# Patient Record
Sex: Female | Born: 1960 | Race: White | Hispanic: No | Marital: Married | Smoking: Never smoker
Health system: Southern US, Community
[De-identification: ages and names within clinical notes are randomized; demographics above are authoritative.]

## PROBLEM LIST (undated history)

## (undated) DIAGNOSIS — I10 Essential (primary) hypertension: Secondary | ICD-10-CM

## (undated) DIAGNOSIS — E079 Disorder of thyroid, unspecified: Secondary | ICD-10-CM

## (undated) HISTORY — PX: KNEE SURGERY: SHX244

---

## 2015-06-29 ENCOUNTER — Encounter (HOSPITAL_COMMUNITY): Payer: Self-pay | Admitting: Emergency Medicine

## 2015-06-29 DIAGNOSIS — I10 Essential (primary) hypertension: Secondary | ICD-10-CM | POA: Insufficient documentation

## 2015-06-29 DIAGNOSIS — Z79899 Other long term (current) drug therapy: Secondary | ICD-10-CM | POA: Insufficient documentation

## 2015-06-29 DIAGNOSIS — R2242 Localized swelling, mass and lump, left lower limb: Secondary | ICD-10-CM | POA: Diagnosis not present

## 2015-06-29 DIAGNOSIS — M25562 Pain in left knee: Secondary | ICD-10-CM | POA: Insufficient documentation

## 2015-06-29 DIAGNOSIS — E079 Disorder of thyroid, unspecified: Secondary | ICD-10-CM | POA: Insufficient documentation

## 2015-06-29 DIAGNOSIS — M79662 Pain in left lower leg: Secondary | ICD-10-CM | POA: Diagnosis present

## 2015-06-29 DIAGNOSIS — M96830 Postprocedural hemorrhage and hematoma of a musculoskeletal structure following a musculoskeletal system procedure: Secondary | ICD-10-CM | POA: Insufficient documentation

## 2015-06-29 NOTE — ED Notes (Addendum)
Pt. reports left posterior knee pain with bruise and  swelling  onset last night , denies injury , pt. uses a knee brace s/p knee surgery last week at [facility].

## 2015-06-30 ENCOUNTER — Emergency Department (HOSPITAL_COMMUNITY)
Admission: EM | Admit: 2015-06-30 | Discharge: 2015-06-30 | Disposition: A | Discharge status: Home / Self Care | Payer: BLUE CROSS/BLUE SHIELD | Attending: Emergency Medicine | Admitting: Emergency Medicine

## 2015-06-30 ENCOUNTER — Emergency Department (HOSPITAL_COMMUNITY): Payer: BLUE CROSS/BLUE SHIELD

## 2015-06-30 DIAGNOSIS — M79662 Pain in left lower leg: Secondary | ICD-10-CM

## 2015-06-30 DIAGNOSIS — M7989 Other specified soft tissue disorders: Secondary | ICD-10-CM

## 2015-06-30 HISTORY — DX: Disorder of thyroid, unspecified: E07.9

## 2015-06-30 HISTORY — DX: Essential (primary) hypertension: I10

## 2015-06-30 MED ORDER — HYDROCODONE-ACETAMINOPHEN 5-325 MG PO TABS: 1.0000 | ORAL_TABLET | Freq: Four times a day (QID) | ORAL | Status: AC | PRN

## 2015-06-30 MED ORDER — MORPHINE SULFATE 4 MG/ML IJ SOLN
4.0000 mg | Freq: Once | INTRAMUSCULAR | Status: AC
  Administered 2015-06-30: 4 mg via INTRAMUSCULAR
  Filled 2015-06-30: qty 1

## 2015-06-30 MED ORDER — RIVAROXABAN 15 MG PO TABS: 15.0000 mg | ORAL_TABLET | Freq: Once | ORAL | Status: DC

## 2015-06-30 MED ORDER — ENOXAPARIN SODIUM 120 MG/0.8ML ~~LOC~~ SOLN
120.0000 mg | SUBCUTANEOUS | Status: AC
  Administered 2015-06-30: 120 mg via SUBCUTANEOUS
  Filled 2015-06-30: qty 0.8

## 2015-06-30 NOTE — ED Provider Notes (Signed)
CSN: [id number]     Arrival date & time 06/29/15  2318 History   First MD Initiated Contact with Patient 06/30/15 0200     Chief Complaint  Patient presents with  . Knee Pain     (Consider location/radiation/quality/duration/timing/severity/associated sxs/prior Treatment) HPI Comments: Patient is a 54 year old female presenting to the emergency department for a violation of left knee and calf swelling and pain. Patient states she had an anterior cruciate ligament repair 1 week ago, states she had to fly in [place] and try down from [place] to [place] for family emergency. She states last night she had new bruising, the shape of her knee immobilizer had increased swelling and pain. Denies any new injuries. She states she has been taking 325 mg of aspirin status post operation but is not on any Lovenox or other blood thinner. She denies any chest pain, shortness of breath, numbness or tingling. Denies any drainage from her surgical incision sites. No history of previous DVT or PE.   Patient is a 54 y.o. female presenting with knee pain.  Knee Pain   Past Medical History  Diagnosis Date  . Hypertension   . Thyroid disease    Past Surgical History  Procedure Laterality Date  . Knee surgery     No family history on file. History  Substance Use Topics  . Smoking status: Never Smoker   . Smokeless tobacco: Not on file  . Alcohol Use: Yes   OB History    No data available     Review of Systems  Musculoskeletal: Positive for myalgias.  All other systems reviewed and are negative.     Allergies  Review of patient's allergies indicates no known allergies.  Home Medications   Prior to Admission medications   Medication Sig Start Date End Date Taking? Authorizing Provider  aspirin EC 325 MG tablet Take 325 mg by mouth at bedtime. 06/20/15  Yes Historical Provider, MD  enalapril-hydrochlorothiazide (VASERETIC) 10-25 MG per tablet Take 1 tablet by mouth daily. 05/17/15  Yes  Historical Provider, MD  levothyroxine (SYNTHROID, LEVOTHROID) 100 MCG tablet Take 100 mcg by mouth daily. 06/13/15  Yes Historical Provider, MD   BP 129/88 mmHg  Pulse 84  Temp(Src) 97.7 F (36.5 C) (Oral)  Resp 24  SpO2 100% Physical Exam  Constitutional: She is oriented to person, place, and time. She appears well-developed and well-nourished. No distress.  HENT:  Head: Normocephalic and atraumatic.  Right Ear: External ear normal.  Left Ear: External ear normal.  Nose: Nose normal.  Mouth/Throat: Oropharynx is clear and moist.  Eyes: Conjunctivae are normal.  Neck: Normal range of motion. Neck supple.  No nuchal rigidity.   Cardiovascular: Normal rate, regular rhythm, normal heart sounds and intact distal pulses.   Pulmonary/Chest: Effort normal.  Abdominal: Soft.  Musculoskeletal:       Left knee: She exhibits swelling and ecchymosis.       Left lower leg: She exhibits tenderness and swelling. She exhibits no deformity.  Left calf swelling. Bruising noted consistent with knee immobilizer. Sensation intact. Surgical incisions clean, dry, intact. No purulent drainage. Mild erythema without warmth noted to left calf. No I swelling or tenderness.  Neurological: She is alert and oriented to person, place, and time.  Skin: Skin is warm and dry. She is not diaphoretic.  Psychiatric: She has a normal mood and affect.  Nursing note and vitals reviewed.   ED Course  Procedures (including critical care time) Medications  morphine 4 MG/ML injection 4  mg (not administered)    Labs Review Labs Reviewed - No data to display  Imaging Review Dg Knee Complete 4 Views Left  06/30/2015   CLINICAL DATA:  54 year old female with left knee pain.  EXAM: LEFT KNEE - COMPLETE 4+ VIEW  COMPARISON:  None.  FINDINGS: There is no acute fracture or dislocation. Prior cruciate ligament surgery noted. There is small suprapatellar effusion. The soft tissues are grossly unremarkable.  IMPRESSION: No  acute fracture or dislocation.   Electronically Signed   By: Elgie CollardArash  Radparvar M.D.   On: 06/30/2015 00:32     EKG Interpretation None      MDM   Final diagnoses:  Pain and swelling of left lower leg    Filed Vitals:   06/30/15 0215  BP: 129/88  Pulse: 84  Temp:   Resp: 24   Afebrile, NAD, non-toxic appearing, AAOx4.   Patient presenting with left calf and knee swelling and pain s/p anterior cruciate ligament [place] ago[place]. Pulses are intact. Bruising is consistent with knee immobilizer rods. Patient is intact. There is no warmth or purulent drainage from incision sites or fever to suggest infectious process. Concern for DVT. No chest pain, shortness of breath, hypoxia, tachycardia or tachypnea to suggest PE at this time. Will discuss with Lovenox and have patient obtain outpatient ultrasound in the morning when offices reopen. Patient d/w with Dr. Mora Bellmanni, agrees with plan.      Francee PiccoloJennifer Matalynn Graff, PA-C 06/30/15 [id number]  Tomasita CrumbleAdeleke Oni, MD 06/30/15 1357

## 2015-06-30 NOTE — Discharge Instructions (Signed)
Please return to the Vascular Lab in the morning for your ultrasound of your leg. You should call to determine your appointment time. Please take pain medication and/or muscle relaxants as prescribed and as needed for pain. Please do not drive on narcotic pain medication or on muscle relaxants. Please read all discharge instructions and return precautions.   Deep Vein Thrombosis A deep vein thrombosis (DVT) is a blood clot that develops in the deep, larger veins of the leg, arm, or pelvis. These are more dangerous than clots that might form in veins near the surface of the body. A DVT can lead to serious and even life-threatening complications if the clot breaks off and travels in the bloodstream to the lungs.  A DVT can damage the valves in your leg veins so that instead of flowing upward, the blood pools in the lower leg. This is called post-thrombotic syndrome, and it can result in pain, swelling, discoloration, and sores on the leg. CAUSES Usually, several things contribute to the formation of blood clots. Contributing factors include:  The flow of blood slows down.  The inside of the vein is damaged in some way.  You have a condition that makes blood clot more easily. RISK FACTORS Some people are more likely than others to develop blood clots. Risk factors include:   Smoking.  Being overweight (obese).  Sitting or lying still for a long time. This includes long-distance travel, paralysis, or recovery from an illness or surgery. Other factors that increase risk are:   Older age, especially over 55 years of age.  Having a family history of blood clots or if you have already had a blot clot.  Having major or lengthy surgery. This is especially true for surgery on the hip, knee, or belly (abdomen). Hip surgery is particularly high risk.  Having a long, thin tube (catheter) placed inside a vein during a medical procedure.  Breaking a hip or leg.  Having cancer or cancer  treatment.  Pregnancy and childbirth.  Hormone changes make the blood clot more easily during pregnancy.  The fetus puts pressure on the veins of the pelvis.  There is a risk of injury to veins during delivery or a caesarean delivery. The risk is highest just after childbirth.  Medicines containing the female hormone estrogen. This includes birth control pills and hormone replacement therapy.  Other circulation or heart problems.  SIGNS AND SYMPTOMS When a clot forms, it can either partially or totally block the blood flow in that vein. Symptoms of a DVT can include:  Swelling of the leg or arm, especially if one side is much worse.  Warmth and redness of the leg or arm, especially if one side is much worse.  Pain in an arm or leg. If the clot is in the leg, symptoms may be more noticeable or worse when standing or walking. The symptoms of a DVT that has traveled to the lungs (pulmonary embolism, PE) usually start suddenly and include:  Shortness of breath.  Coughing.  Coughing up blood or blood-tinged mucus.  Chest pain. The chest pain is often worse with deep breaths.  Rapid heartbeat. Anyone with these symptoms should get emergency medical treatment right away. Do not wait to see if the symptoms will go away. Call your local emergency services (911 in the [place] if you have these symptoms. Do not drive yourself to the hospital. DIAGNOSIS If a DVT is suspected, your health care provider will take a full medical history and perform a  physical exam. Tests that also may be required include:  Blood tests, including studies of the clotting properties of the blood.  Ultrasound to see if you have clots in your legs or lungs.  X-rays to show the flow of blood when dye is injected into the veins (venogram).  Studies of your lungs if you have any chest symptoms. PREVENTION  Exercise the legs regularly. Take a brisk 30-minute walk every day.  Maintain a weight that is  appropriate for your height.  Avoid sitting or lying in bed for long periods of time without moving your legs.  Women, particularly those over the age of 55 years, should consider the risks and benefits of taking estrogen medicines, including birth control pills.  Do not smoke, especially if you take estrogen medicines.  Long-distance travel can increase your risk of DVT. You should exercise your legs by walking or pumping the muscles every hour.  Many of the risk factors above relate to situations that exist with hospitalization, either for illness, injury, or elective surgery. Prevention may include medical and nonmedical measures.  Your health care provider will assess you for the need for venous thromboembolism prevention when you are admitted to the hospital. If you are having surgery, your surgeon will assess you the day of or day after surgery. TREATMENT Once identified, a DVT can be treated. It can also be prevented in some circumstances. Once you have had a DVT, you may be at increased risk for a DVT in the future. The most common treatment for DVT is blood-thinning (anticoagulant) medicine, which reduces the blood's tendency to clot. Anticoagulants can stop new blood clots from forming and stop old clots from growing. They cannot dissolve existing clots. Your body does this by itself over time. Anticoagulants can be given by mouth, through an IV tube, or by injection. Your health care provider will determine the best program for you. Other medicines or treatments that may be used are:  Heparin or related medicines (low molecular weight heparin) are often the first treatment for a blood clot. They act quickly. However, they cannot be taken orally and must be given either in shot form or by IV tube.  Heparin can cause a fall in a component of blood that stops bleeding and forms blood clots (platelets). You will be monitored with blood tests to be sure this does not occur.  Warfarin is an  anticoagulant that can be swallowed. It takes a few days to start working, so usually heparin or related medicines are used in combination. Once warfarin is working, heparin is usually stopped.  Factor Xa inhibitor medicines, such as rivaroxaban and apixaban, also reduce blood clotting. These medicines are taken orally and can often be used without heparin or related medicines.  Less commonly, clot dissolving drugs (thrombolytics) are used to dissolve a DVT. They carry a high risk of bleeding, so they are used mainly in severe cases where your life or a part of your body is threatened.  Very rarely, a blood clot in the leg needs to be removed surgically.  If you are unable to take anticoagulants, your health care provider may arrange for you to have a filter placed in a main vein in your abdomen. This filter prevents clots from traveling to your lungs. HOME CARE INSTRUCTIONS  Take all medicines as directed by your health care provider.  Learn as much as you can about DVT.  Wear a medical alert bracelet or carry a medical alert card.  Ask your  health care provider how soon you can go back to normal activities. It is important to stay active to prevent blood clots. If you are on anticoagulant medicine, avoid contact sports.  It is very important to exercise. This is especially important while traveling, sitting, or standing for long periods of time. Exercise your legs by walking or by tightening and relaxing your leg muscles regularly. Take frequent walks.  You may need to wear compression stockings. These are tight elastic stockings that apply pressure to the lower legs. This pressure can help keep the blood in the legs from clotting. Taking Warfarin Warfarin is a daily medicine that is taken by mouth. Your health care provider will advise you on the length of treatment (usually 3-6 months, sometimes lifelong). If you take warfarin:  Understand how to take warfarin and foods that can affect  how warfarin works in Veterinary surgeon.  Too much and too little warfarin are both dangerous. Too much warfarin increases the risk of bleeding. Too little warfarin continues to allow the risk for blood clots. Warfarin and Regular Blood Testing While taking warfarin, you will need to have regular blood tests to measure your blood clotting time. These blood tests usually include both the prothrombin time (PT) and international normalized ratio (INR) tests. The PT and INR results allow your health care provider to adjust your dose of warfarin. It is very important that you have your PT and INR tested as often as directed by your health care provider.  Warfarin and Your Diet Avoid major changes in your diet, or notify your health care provider before changing your diet. Arrange a visit with a registered dietitian to answer your questions. Many foods, especially foods high in vitamin K, can interfere with warfarin and affect the PT and INR results. You should eat a consistent amount of foods high in vitamin K. Foods high in vitamin K include:   Spinach, kale, broccoli, cabbage, collard and turnip greens, Brussels sprouts, peas, cauliflower, seaweed, and parsley.  Beef and pork liver.  Green tea.  Soybean oil. Warfarin with Other Medicines Many medicines can interfere with warfarin and affect the PT and INR results. You must:  Tell your health care provider about any and all medicines, vitamins, and supplements you take, including aspirin and other over-the-counter anti-inflammatory medicines. Be especially cautious with aspirin and anti-inflammatory medicines. Ask your health care provider before taking these.  Do not take or discontinue any prescribed or over-the-counter medicine except on the advice of your health care provider or pharmacist. Warfarin Side Effects Warfarin can have side effects, such as easy bruising and difficulty stopping bleeding. Ask your health care provider or pharmacist about  other side effects of warfarin. You will need to:  Hold pressure over cuts for longer than usual.  Notify your dentist and other health care providers that you are taking warfarin before you undergo any procedures where bleeding may occur. Warfarin with Alcohol and Tobacco   Drinking alcohol frequently can increase the effect of warfarin, leading to excess bleeding. It is best to avoid alcoholic drinks or to consume only very small amounts while taking warfarin. Notify your health care provider if you change your alcohol intake.   Do not use any tobacco products including cigarettes, chewing tobacco, or electronic cigarettes. If you smoke, quit. Ask your health care provider for help with quitting smoking. Alternative Medicines to Warfarin: Factor Xa Inhibitor Medicines  These blood-thinning medicines are taken by mouth, usually for several weeks or longer. It is  important to take the medicine every single day at the same time each day.  There are no regular blood tests required when using these medicines.  There are fewer food and drug interactions than with warfarin.  The side effects of this class of medicine are similar to those of warfarin, including excessive bruising or bleeding. Ask your health care provider or pharmacist about other potential side effects. SEEK MEDICAL CARE IF:  You notice a rapid heartbeat.  You feel weaker or more tired than usual.  You feel faint.  You notice increased bruising.  You feel your symptoms are not getting better in the time expected.  You believe you are having side effects of medicine. SEEK IMMEDIATE MEDICAL CARE IF:  You have chest pain.  You have trouble breathing.  You have new or increased swelling or pain in one leg.  You cough up blood.  You notice blood in vomit, in a bowel movement, or in urine. MAKE SURE YOU:  Understand these instructions.  Will watch your condition.  Will get help right away if you are not doing  well or get worse. Document Released: 12/02/2005 Document Revised: 04/18/2014 Document Reviewed: 08/09/2013 [facility] Patient Information 2015 [place], [place]. This information is not intended to replace advice given to you by your health care provider. Make sure you discuss any questions you have with your health care provider.

## 2015-07-01 ENCOUNTER — Ambulatory Visit (HOSPITAL_COMMUNITY)
Admission: RE | Admit: 2015-07-01 | Discharge: 2015-07-01 | Disposition: A | Discharge status: Home / Self Care | Payer: BLUE CROSS/BLUE SHIELD | Source: Ambulatory Visit | Attending: Emergency Medicine | Admitting: Emergency Medicine

## 2015-07-01 DIAGNOSIS — M79609 Pain in unspecified limb: Secondary | ICD-10-CM

## 2015-07-01 DIAGNOSIS — M7989 Other specified soft tissue disorders: Secondary | ICD-10-CM | POA: Insufficient documentation

## 2015-07-01 DIAGNOSIS — M79605 Pain in left leg: Secondary | ICD-10-CM | POA: Diagnosis present

## 2015-07-01 NOTE — Progress Notes (Signed)
VASCULAR LAB PRELIMINARY  PRELIMINARY  PRELIMINARY  PRELIMINARY  Left lower extremity venous Doppler completed.    Preliminary report:  There is no DVT or SVT noted in the left lower extremity.   Demaryius Imran, RVT 07/01/2015, 3:56 PM

## 2016-08-10 IMAGING — DX DG KNEE COMPLETE 4+V*L*
4 series · 4 of 4 positions shown · non-contrast
Comparison: None.

CLINICAL DATA: 53-year-old female with left knee pain.

EXAM:
LEFT KNEE - COMPLETE 4+ VIEW

[knee ap]
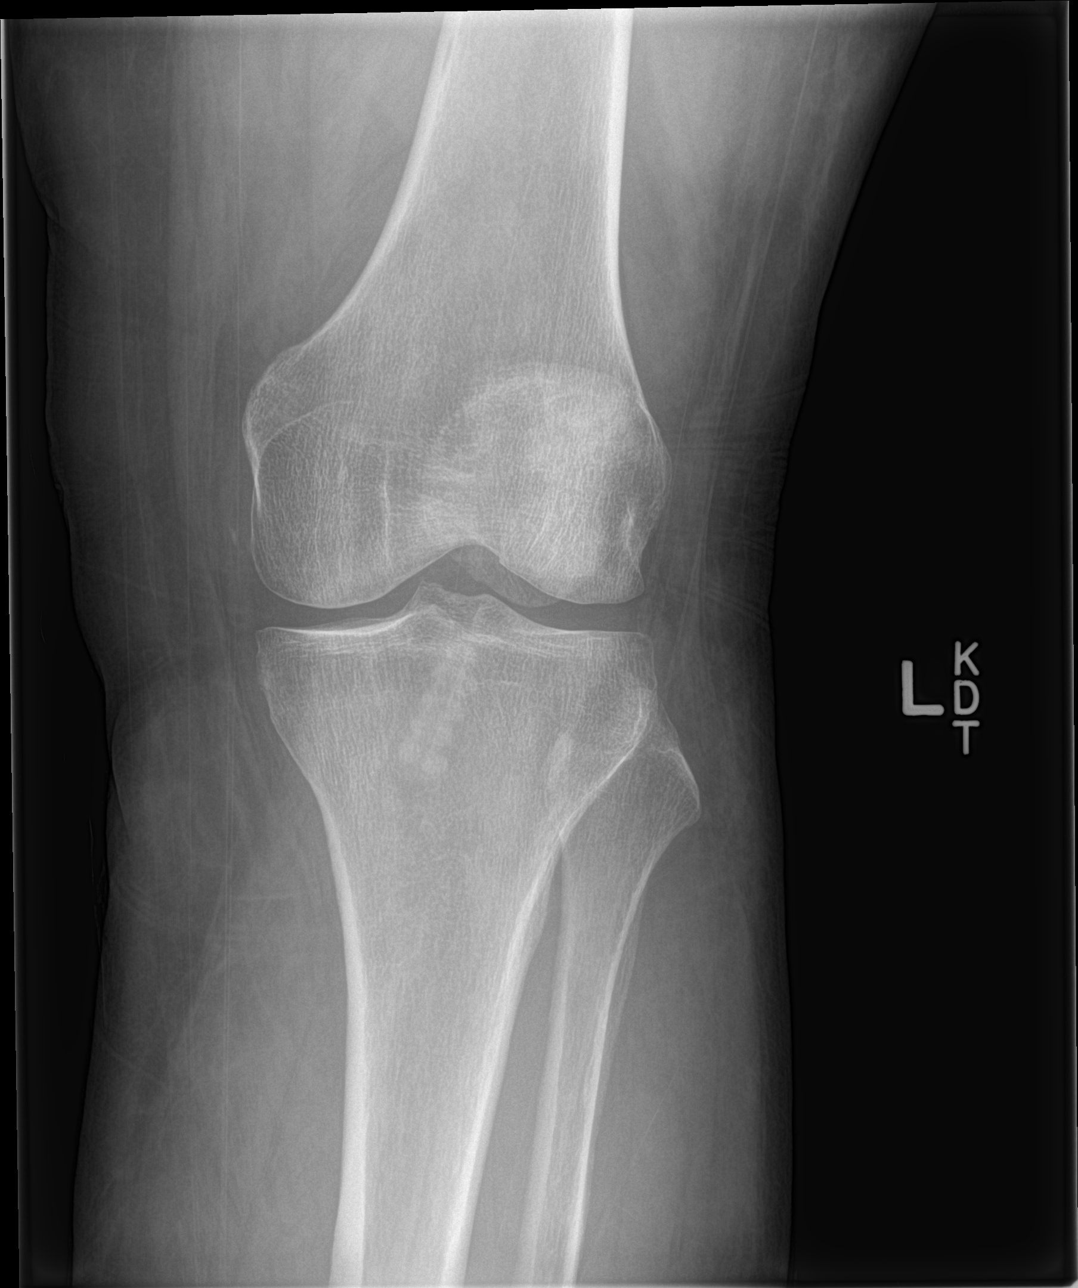

[knee lat]
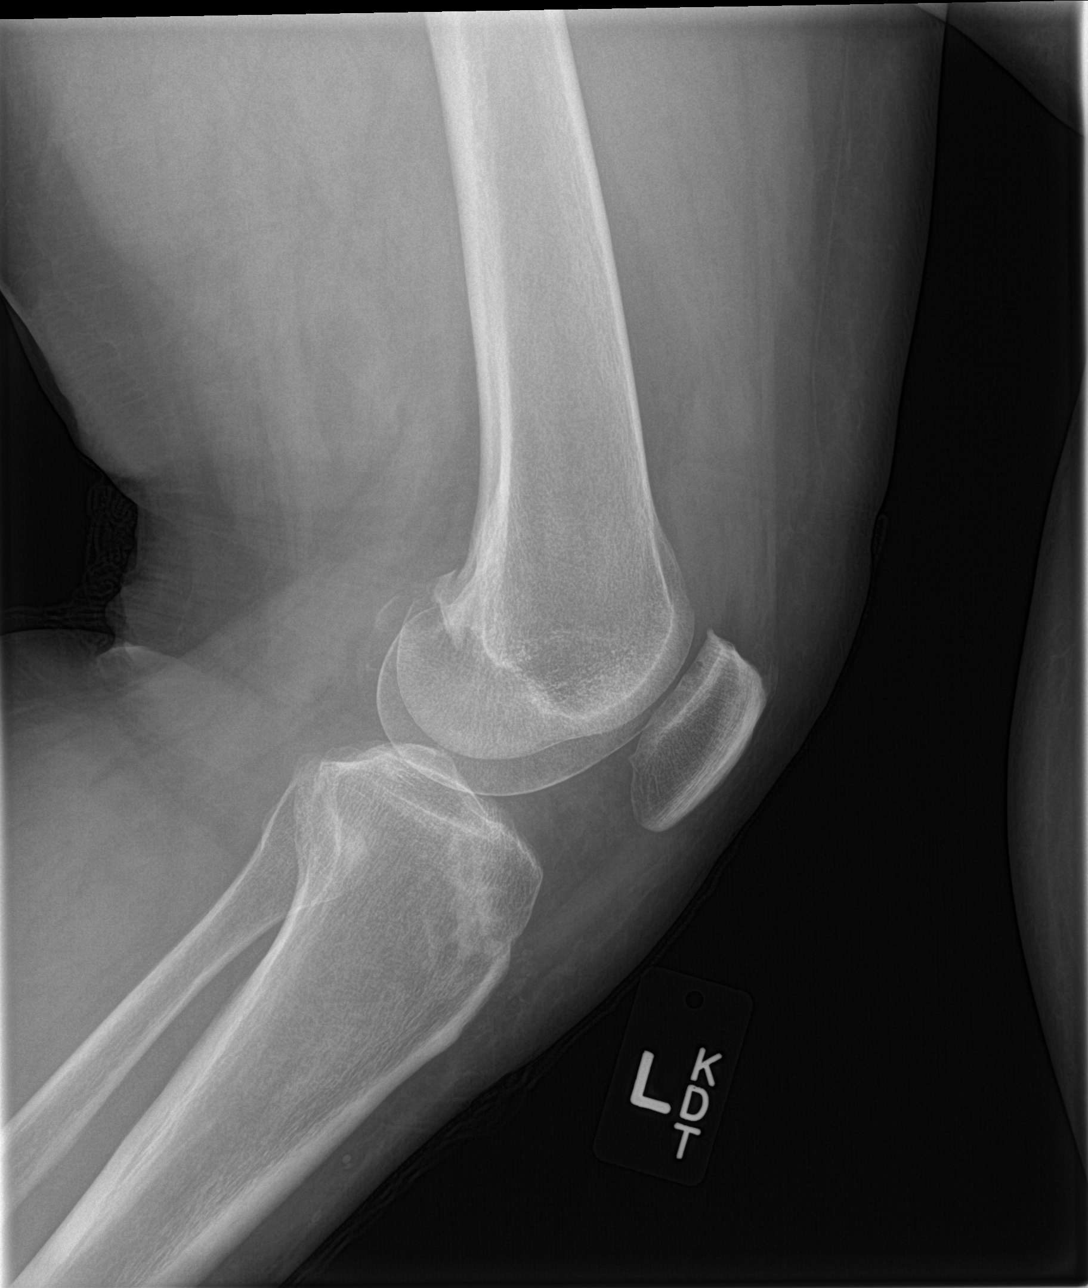

[knee obl (1 of 2)]
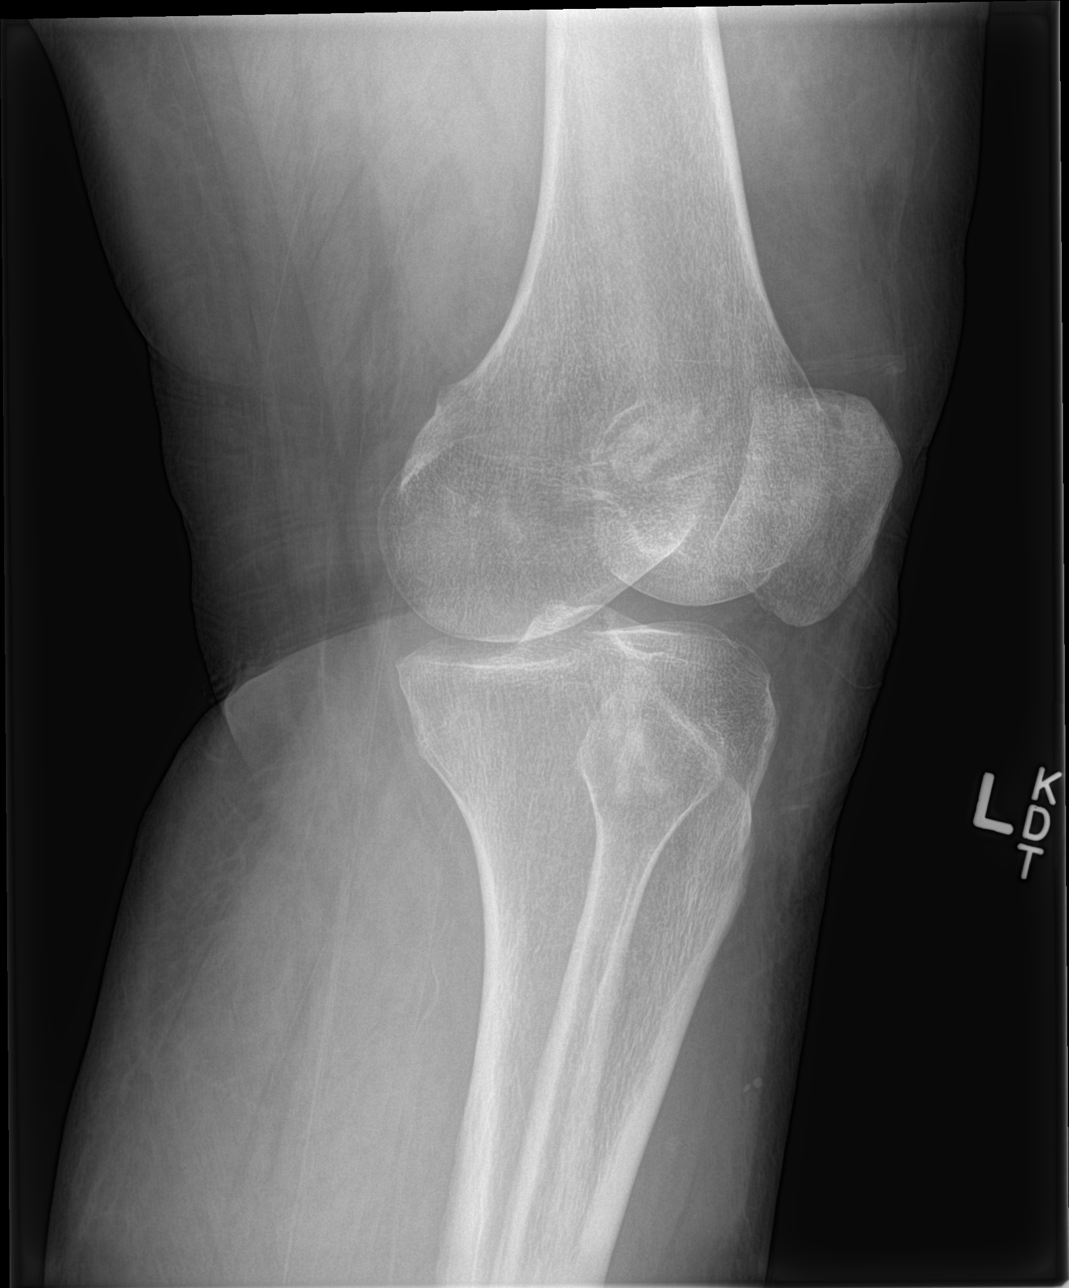

[knee obl (2 of 2)]
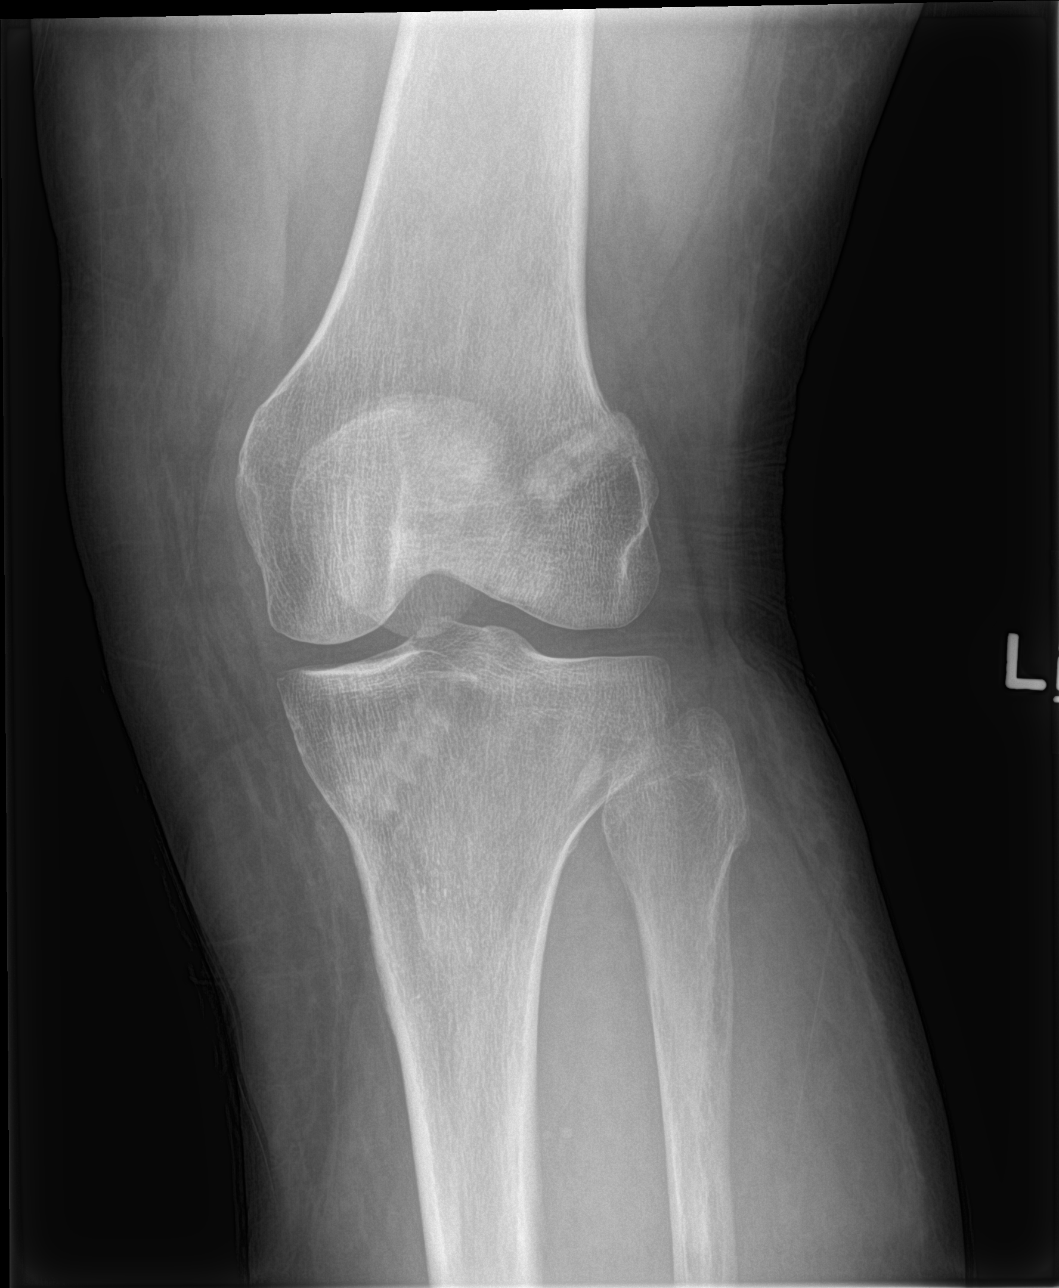

[4 of 4 positions shown; findings below may reference images not displayed]

FINDINGS: There is no acute fracture or dislocation. Prior cruciate ligament
surgery noted. There is small suprapatellar effusion. The soft
tissues are grossly unremarkable.
IMPRESSION: No acute fracture or dislocation.
